# Patient Record
Sex: Female | Born: 1945 | Race: White | Hispanic: No
Health system: Southern US, Community
[De-identification: ages and names within clinical notes are randomized; demographics above are authoritative.]

---

## 1999-02-24 ENCOUNTER — Other Ambulatory Visit: Admission: RE | Admit: 1999-02-24 | Discharge: 1999-02-24 | Payer: Self-pay | Admitting: Obstetrics and Gynecology

## 1999-02-25 ENCOUNTER — Other Ambulatory Visit: Admission: RE | Admit: 1999-02-25 | Discharge: 1999-02-25 | Payer: Self-pay | Admitting: Obstetrics and Gynecology

## 2000-04-28 ENCOUNTER — Other Ambulatory Visit: Admission: RE | Admit: 2000-04-28 | Discharge: 2000-04-28 | Payer: Self-pay | Admitting: Obstetrics and Gynecology

## 2001-07-03 ENCOUNTER — Emergency Department (HOSPITAL_COMMUNITY): Admission: EM | Admit: 2001-07-03 | Discharge: 2001-07-03 | Payer: Self-pay | Admitting: Emergency Medicine

## 2001-07-12 ENCOUNTER — Other Ambulatory Visit: Admission: RE | Admit: 2001-07-12 | Discharge: 2001-07-12 | Payer: Self-pay | Admitting: Gynecology

## 2002-06-22 ENCOUNTER — Encounter: Payer: Self-pay | Admitting: Emergency Medicine

## 2002-06-22 ENCOUNTER — Inpatient Hospital Stay (HOSPITAL_COMMUNITY): Admission: EM | Admit: 2002-06-22 | Discharge: 2002-06-25 | Payer: Self-pay | Admitting: Emergency Medicine

## 2010-10-28 ENCOUNTER — Other Ambulatory Visit: Payer: Self-pay | Admitting: Family Medicine

## 2013-05-23 ENCOUNTER — Other Ambulatory Visit (HOSPITAL_COMMUNITY): Payer: Self-pay | Admitting: Endocrinology

## 2013-05-23 DIAGNOSIS — E0590 Thyrotoxicosis, unspecified without thyrotoxic crisis or storm: Secondary | ICD-10-CM

## 2013-05-29 ENCOUNTER — Ambulatory Visit (HOSPITAL_COMMUNITY)
Admission: RE | Admit: 2013-05-29 | Discharge: 2013-05-29 | Disposition: A | Discharge status: Home / Self Care | Payer: Medicare Other | Source: Ambulatory Visit | Attending: Endocrinology | Admitting: Endocrinology

## 2013-05-29 DIAGNOSIS — E0590 Thyrotoxicosis, unspecified without thyrotoxic crisis or storm: Secondary | ICD-10-CM

## 2013-05-29 MED ORDER — SODIUM IODIDE I 131 CAPSULE
6.7000 | Freq: Once | INTRAVENOUS | Status: AC | PRN
  Administered 2013-05-29: 6.7 via ORAL

## 2013-05-30 ENCOUNTER — Encounter (HOSPITAL_COMMUNITY)
Admission: RE | Admit: 2013-05-30 | Discharge: 2013-05-30 | Disposition: A | Discharge status: Home / Self Care | Payer: Medicare Other | Source: Ambulatory Visit | Attending: Endocrinology | Admitting: Endocrinology

## 2013-05-30 MED ORDER — SODIUM PERTECHNETATE TC 99M INJECTION
10.0000 | Freq: Once | INTRAVENOUS | Status: AC | PRN
  Administered 2013-05-30: 10 via INTRAVENOUS

## 2013-06-01 ENCOUNTER — Ambulatory Visit (HOSPITAL_COMMUNITY): Payer: Self-pay

## 2013-06-02 ENCOUNTER — Encounter (HOSPITAL_COMMUNITY): Payer: Self-pay

## 2013-11-29 ENCOUNTER — Other Ambulatory Visit: Payer: Self-pay | Admitting: *Deleted

## 2013-11-29 DIAGNOSIS — R002 Palpitations: Secondary | ICD-10-CM

## 2013-11-30 ENCOUNTER — Encounter: Payer: Self-pay | Admitting: *Deleted

## 2013-11-30 ENCOUNTER — Encounter (INDEPENDENT_AMBULATORY_CARE_PROVIDER_SITE_OTHER): Payer: Medicare Other

## 2013-11-30 DIAGNOSIS — R002 Palpitations: Secondary | ICD-10-CM

## 2013-11-30 NOTE — Progress Notes (Signed)
Patient ID: Cynthia Solomon, female   DOB: 02/12/1945, 68 y.o.   MRN: [id number] [employer] verite 7 day cardiac event monitor applied to patient.

## 2015-03-08 DIAGNOSIS — H401133 Primary open-angle glaucoma, bilateral, severe stage: Secondary | ICD-10-CM | POA: Diagnosis not present

## 2015-03-08 DIAGNOSIS — H2513 Age-related nuclear cataract, bilateral: Secondary | ICD-10-CM | POA: Diagnosis not present

## 2015-03-08 DIAGNOSIS — H43813 Vitreous degeneration, bilateral: Secondary | ICD-10-CM | POA: Diagnosis not present

## 2015-03-08 DIAGNOSIS — H524 Presbyopia: Secondary | ICD-10-CM | POA: Diagnosis not present

## 2015-04-10 DIAGNOSIS — F419 Anxiety disorder, unspecified: Secondary | ICD-10-CM | POA: Diagnosis not present

## 2015-05-22 DIAGNOSIS — J45909 Unspecified asthma, uncomplicated: Secondary | ICD-10-CM | POA: Diagnosis not present

## 2015-06-14 DIAGNOSIS — H401113 Primary open-angle glaucoma, right eye, severe stage: Secondary | ICD-10-CM | POA: Diagnosis not present

## 2015-06-26 DIAGNOSIS — H401121 Primary open-angle glaucoma, left eye, mild stage: Secondary | ICD-10-CM | POA: Diagnosis not present

## 2015-06-26 DIAGNOSIS — H401113 Primary open-angle glaucoma, right eye, severe stage: Secondary | ICD-10-CM | POA: Diagnosis not present

## 2015-08-05 DIAGNOSIS — F4320 Adjustment disorder, unspecified: Secondary | ICD-10-CM | POA: Diagnosis not present

## 2015-10-30 DIAGNOSIS — F4320 Adjustment disorder, unspecified: Secondary | ICD-10-CM | POA: Diagnosis not present

## 2015-12-30 DIAGNOSIS — H401121 Primary open-angle glaucoma, left eye, mild stage: Secondary | ICD-10-CM | POA: Diagnosis not present

## 2015-12-30 DIAGNOSIS — H401113 Primary open-angle glaucoma, right eye, severe stage: Secondary | ICD-10-CM | POA: Diagnosis not present

## 2016-03-04 DIAGNOSIS — F4320 Adjustment disorder, unspecified: Secondary | ICD-10-CM | POA: Diagnosis not present

## 2016-03-04 DIAGNOSIS — L659 Nonscarring hair loss, unspecified: Secondary | ICD-10-CM | POA: Diagnosis not present

## 2016-03-04 DIAGNOSIS — M542 Cervicalgia: Secondary | ICD-10-CM | POA: Diagnosis not present

## 2016-03-09 DIAGNOSIS — M79671 Pain in right foot: Secondary | ICD-10-CM | POA: Diagnosis not present

## 2016-03-27 DIAGNOSIS — Z1231 Encounter for screening mammogram for malignant neoplasm of breast: Secondary | ICD-10-CM | POA: Diagnosis not present

## 2016-03-30 DIAGNOSIS — N6489 Other specified disorders of breast: Secondary | ICD-10-CM | POA: Diagnosis not present

## 2016-04-08 DIAGNOSIS — M79671 Pain in right foot: Secondary | ICD-10-CM | POA: Diagnosis not present

## 2016-04-20 DIAGNOSIS — M25571 Pain in right ankle and joints of right foot: Secondary | ICD-10-CM | POA: Diagnosis not present

## 2016-05-21 DIAGNOSIS — M79671 Pain in right foot: Secondary | ICD-10-CM | POA: Diagnosis not present

## 2016-06-02 DIAGNOSIS — H401133 Primary open-angle glaucoma, bilateral, severe stage: Secondary | ICD-10-CM | POA: Diagnosis not present

## 2016-06-02 DIAGNOSIS — H2513 Age-related nuclear cataract, bilateral: Secondary | ICD-10-CM | POA: Diagnosis not present

## 2016-06-02 DIAGNOSIS — H524 Presbyopia: Secondary | ICD-10-CM | POA: Diagnosis not present

## 2016-06-18 DIAGNOSIS — M79671 Pain in right foot: Secondary | ICD-10-CM | POA: Diagnosis not present

## 2016-06-18 DIAGNOSIS — M25511 Pain in right shoulder: Secondary | ICD-10-CM | POA: Diagnosis not present

## 2016-06-26 DIAGNOSIS — H401121 Primary open-angle glaucoma, left eye, mild stage: Secondary | ICD-10-CM | POA: Diagnosis not present

## 2016-06-26 DIAGNOSIS — H401113 Primary open-angle glaucoma, right eye, severe stage: Secondary | ICD-10-CM | POA: Diagnosis not present

## 2016-07-01 DIAGNOSIS — H401113 Primary open-angle glaucoma, right eye, severe stage: Secondary | ICD-10-CM | POA: Diagnosis not present

## 2016-07-01 DIAGNOSIS — H401121 Primary open-angle glaucoma, left eye, mild stage: Secondary | ICD-10-CM | POA: Diagnosis not present

## 2016-07-01 DIAGNOSIS — H2513 Age-related nuclear cataract, bilateral: Secondary | ICD-10-CM | POA: Diagnosis not present

## 2016-07-02 DIAGNOSIS — F4320 Adjustment disorder, unspecified: Secondary | ICD-10-CM | POA: Diagnosis not present

## 2016-07-02 DIAGNOSIS — R21 Rash and other nonspecific skin eruption: Secondary | ICD-10-CM | POA: Diagnosis not present

## 2019-02-24 ENCOUNTER — Ambulatory Visit: Payer: Medicare HMO | Attending: Internal Medicine

## 2019-02-24 DIAGNOSIS — Z23 Encounter for immunization: Secondary | ICD-10-CM

## 2019-02-24 NOTE — Progress Notes (Signed)
   Covid-19 Vaccination Clinic  Name:  Cynthia Solomon    MRN: [id number] DOB: 12-11-1945  02/24/2019  [name] was observed post Covid-19 immunization for 15 minutes without incidence. She was provided with Vaccine Information Sheet and instruction to access the V-Safe system.   [name] was instructed to call 911 with any severe reactions post vaccine: Marland Kitchen Difficulty breathing  . Swelling of your face and throat  . A fast heartbeat  . A bad rash all over your body  . Dizziness and weakness    Immunizations Administered    Name Date Dose VIS Date Route   Pfizer COVID-19 Vaccine 02/24/2019  9:47 AM 0.3 mL 01/13/2019 Intramuscular   Manufacturer: [employer], [employer]   Lot: [id number]   NDC: [address]-0

## 2019-03-16 ENCOUNTER — Ambulatory Visit: Payer: Medicare HMO | Attending: Internal Medicine

## 2019-03-16 DIAGNOSIS — Z23 Encounter for immunization: Secondary | ICD-10-CM | POA: Insufficient documentation

## 2019-03-16 NOTE — Progress Notes (Signed)
   Covid-19 Vaccination Clinic  Name:  AJAI TERHAAR    MRN: [id number] DOB: 1945-10-22  03/16/2019  Ms. Habib was observed post Covid-19 immunization for 15 minutes without incidence. She was provided with Vaccine Information Sheet and instruction to access the V-Safe system.   Ms. Hagberg was instructed to call 911 with any severe reactions post vaccine: Marland Kitchen Difficulty breathing  . Swelling of your face and throat  . A fast heartbeat  . A bad rash all over your body  . Dizziness and weakness    Immunizations Administered    Name Date Dose VIS Date Route   Pfizer COVID-19 Vaccine 03/16/2019  3:10 PM 0.3 mL 01/13/2019 Intramuscular   Manufacturer: [employer], [employer]   Lot: [id number]   NDC: [id number]

## 2019-03-22 ENCOUNTER — Ambulatory Visit: Payer: Self-pay

## 2020-06-28 ENCOUNTER — Emergency Department (HOSPITAL_COMMUNITY): Payer: Medicare HMO

## 2020-06-28 ENCOUNTER — Emergency Department (HOSPITAL_COMMUNITY)
Admission: EM | Admit: 2020-06-28 | Discharge: 2020-06-29 | Disposition: A | Discharge status: Home / Self Care | Payer: Medicare HMO | Attending: Emergency Medicine | Admitting: Emergency Medicine

## 2020-06-28 DIAGNOSIS — W541XXA Struck by dog, initial encounter: Secondary | ICD-10-CM | POA: Insufficient documentation

## 2020-06-28 DIAGNOSIS — S7002XA Contusion of left hip, initial encounter: Secondary | ICD-10-CM | POA: Insufficient documentation

## 2020-06-28 DIAGNOSIS — R58 Hemorrhage, not elsewhere classified: Secondary | ICD-10-CM

## 2020-06-28 DIAGNOSIS — M1990 Unspecified osteoarthritis, unspecified site: Secondary | ICD-10-CM

## 2020-06-28 DIAGNOSIS — S79922A Unspecified injury of left thigh, initial encounter: Secondary | ICD-10-CM | POA: Diagnosis present

## 2020-06-28 NOTE — ED Triage Notes (Signed)
Pt came in with c/o L hip pain after getting knocked over by her dog in the driveway. Abrasion noted to L elbow as well. No shortening noted. Pt states it hurts to bear weight

## 2020-06-28 NOTE — ED Provider Notes (Signed)
Emergency Medicine Provider Triage Evaluation Note  Cynthia Solomon , a 75 y.o. female  was evaluated in triage.  Pt complains of left hip pain after a fall.  Review of Systems  Positive: Left hip pain Negative: No head trauma or loc  Physical Exam  BP (!) 137/94 (BP Location: Right Arm)   Pulse 83   Temp 98.1 F (36.7 C) (Oral)   Resp 18   Ht 5\' 3"  (1.6 m)   Wt 56.7 kg   SpO2 97%   BMI 22.14 kg/m  Gen:   Awake, no distress   Resp:  Normal effort  MSK:   Moves extremities without difficulty  Other:  ttp to the left hip, clear speech  Medical Decision Making  Medically screening exam initiated at 9:52 PM.  Appropriate orders placed.  JERZEY KOMPERDA was informed that the remainder of the evaluation will be completed by another provider, this initial triage assessment does not replace that evaluation, and the importance of remaining in the ED until their evaluation is complete.     Thompson Grayer 06/28/20 2154    2155, MD 06/28/20 2328

## 2020-06-29 ENCOUNTER — Encounter (HOSPITAL_COMMUNITY): Payer: Self-pay | Admitting: Emergency Medicine

## 2020-06-29 MED ORDER — DICLOFENAC SODIUM ER 100 MG PO TB24: 100.0000 mg | ORAL_TABLET | Freq: Every day | ORAL | 0 refills | Status: AC

## 2020-06-29 MED ORDER — LIDOCAINE 5 % EX PTCH: 1.0000 | MEDICATED_PATCH | CUTANEOUS | 0 refills | Status: AC

## 2020-06-29 MED ORDER — IBUPROFEN 800 MG PO TABS
800.0000 mg | ORAL_TABLET | Freq: Once | ORAL | Status: AC
  Administered 2020-06-29: 800 mg via ORAL
  Filled 2020-06-29: qty 1

## 2020-06-29 MED ORDER — LIDOCAINE 5 % EX PTCH
2.0000 | MEDICATED_PATCH | CUTANEOUS | Status: DC
  Administered 2020-06-29: 2 via TRANSDERMAL
  Filled 2020-06-29: qty 2

## 2020-06-29 NOTE — ED Provider Notes (Signed)
[facility]-EMERGENCY DEPT Provider Note   CSN: [id number] Arrival date & time: 06/28/20  2124     History Chief Complaint  Patient presents with  . Hip Pain    Cynthia Solomon is a 75 y.o. female.  The history is provided by the patient.  Hip Pain This is a new problem. The current episode started 3 to 5 hours ago. The problem occurs constantly. The problem has not changed since onset.Pertinent negatives include no chest pain, no abdominal pain, no headaches and no shortness of breath. Nothing aggravates the symptoms. Nothing relieves the symptoms. She has tried nothing for the symptoms. The treatment provided no relief.  Patient was hit in the posterior left hip by a dog and has a bruise and it hurts to walk.       History reviewed. No pertinent past medical history.  There are no problems to display for this patient.   History reviewed. No pertinent surgical history.   OB History   No obstetric history on file.     History reviewed. No pertinent family history.     Home Medications Prior to Admission medications   Not on File    Allergies    Contrast media [iodinated diagnostic agents], Singulair [montelukast], and Sulfites  Review of Systems   Review of Systems  Constitutional: Negative for fever.  HENT: Negative for congestion.   Eyes: Negative for visual disturbance.  Respiratory: Negative for shortness of breath.   Cardiovascular: Negative for chest pain.  Gastrointestinal: Negative for abdominal pain.  Genitourinary: Negative for difficulty urinating.  Musculoskeletal: Positive for arthralgias.  Skin: Negative for rash.  Neurological: Negative for headaches.  Psychiatric/Behavioral: Negative for agitation.  All other systems reviewed and are negative.   Physical Exam Updated Vital Signs BP 130/84   Pulse 82   Temp 98.6 F (37 C) (Oral)   Resp 18   Ht 5\' 3"  (1.6 m)   Wt 56.7 kg   SpO2 100%   BMI 22.14 kg/m    Physical Exam Vitals and nursing note reviewed.  Constitutional:      General: She is not in acute distress.    Appearance: Normal appearance.  HENT:     Head: Normocephalic and atraumatic.     Nose: Nose normal.  Eyes:     Conjunctiva/sclera: Conjunctivae normal.     Pupils: Pupils are equal, round, and reactive to light.  Cardiovascular:     Rate and Rhythm: Normal rate and regular rhythm.     Pulses: Normal pulses.     Heart sounds: Normal heart sounds.  Pulmonary:     Effort: Pulmonary effort is normal.     Breath sounds: Normal breath sounds.  Abdominal:     General: Abdomen is flat. Bowel sounds are normal.     Palpations: Abdomen is soft.     Tenderness: There is no abdominal tenderness. There is no guarding.  Musculoskeletal:        General: Normal range of motion.     Cervical back: Normal range of motion and neck supple.     Left hip: No deformity, lacerations or crepitus. Normal range of motion. Normal strength.     Left upper leg: Normal.     Left knee: Normal.     Left ankle: Normal.     Left Achilles Tendon: Normal.     Left foot: Normal.       Legs:  Skin:    General: Skin is warm and dry.  Capillary Refill: Capillary refill takes less than 2 seconds.  Neurological:     General: No focal deficit present.     Mental Status: She is alert and oriented to person, place, and time.     Deep Tendon Reflexes: Reflexes normal.  Psychiatric:        Mood and Affect: Mood normal.        Behavior: Behavior normal.     ED Results / Procedures / Treatments   Labs (all labs ordered are listed, but only abnormal results are displayed) Labs Reviewed - No data to display  EKG None  Radiology DG Hip Unilat W or Wo Pelvis 2-3 Views Left  Result Date: 06/28/2020 CLINICAL DATA:  Fall with left hip pain. EXAM: DG HIP (WITH OR WITHOUT PELVIS) 2-3V LEFT COMPARISON:  None. FINDINGS: No evidence of acute fracture. The femoral head is well seated. Mild left hip  osteoarthritis with acetabular spurring. Pubic rami are intact. Pubic symphysis and sacroiliac joints are congruent. There is a pessary in place. IMPRESSION: Mild left hip osteoarthritis. No acute fracture. Electronically Signed   By: Narda Rutherford M.D.   On: 06/28/2020 22:40    Procedures Procedures   Medications Ordered in ED Medications  lidocaine (LIDODERM) 5 % 2 patch (2 patches Transdermal Patch Applied 06/29/20 0051)  ibuprofen (ADVIL) tablet 800 mg (has no administration in time range)    ED Course  I have reviewed the triage vital signs and the nursing notes.  Pertinent labs & imaging results that were available during my care of the patient were reviewed by me and considered in my medical decision making (see chart for details).    Patient with ecchymosis.  No foreshortening no rotation.  No pain with motion.  FROM.  Xrays are negative.  Patient is stable for discharge with close follow up.    Cynthia Solomon was evaluated in Emergency Department on 06/29/2020 for the symptoms described in the history of present illness. She was evaluated in the context of the global COVID-19 pandemic, which necessitated consideration that the patient might be at risk for infection with the SARS-CoV-2 virus that causes COVID-19. Institutional protocols and algorithms that pertain to the evaluation of patients at risk for COVID-19 are in a state of rapid change based on information released by regulatory bodies including the CDC and federal and state organizations. These policies and algorithms were followed during the patient's care in the ED.]  Final Clinical Impression(s) / ED Diagnoses Return for intractable cough, coughing up blood, fevers >100.4 unrelieved by medication, shortness of breath, intractable vomiting, chest pain, shortness of breath, weakness, numbness, changes in speech, facial asymmetry, abdominal pain, passing out, Inability to tolerate liquids or food, cough, altered mental  status or any concerns. No signs of systemic illness or infection. The patient is nontoxic-appearing on exam and vital signs are within normal limits.  I have reviewed the triage vital signs and the nursing notes. Pertinent labs & imaging results that were available during my care of the patient were reviewed by me and considered in my medical decision making (see chart for details). After history, exam, and medical workup I feel the patient has been appropriately medically screened and is safe for discharge home. Pertinent diagnoses were discussed with the patient. Patient was given return precautions.   Lachlan Mckim, MD 06/29/20 4008

## 2020-10-17 LAB — COLOGUARD: COLOGUARD: NEGATIVE

## 2022-12-17 IMAGING — CR DG HIP (WITH OR WITHOUT PELVIS) 2-3V*L*
3 series · 3 of 3 positions shown · non-contrast
Comparison: None.

CLINICAL DATA: Fall with left hip pain.

EXAM:
DG HIP (WITH OR WITHOUT PELVIS) 2-3V LEFT

[t pelvis ap]
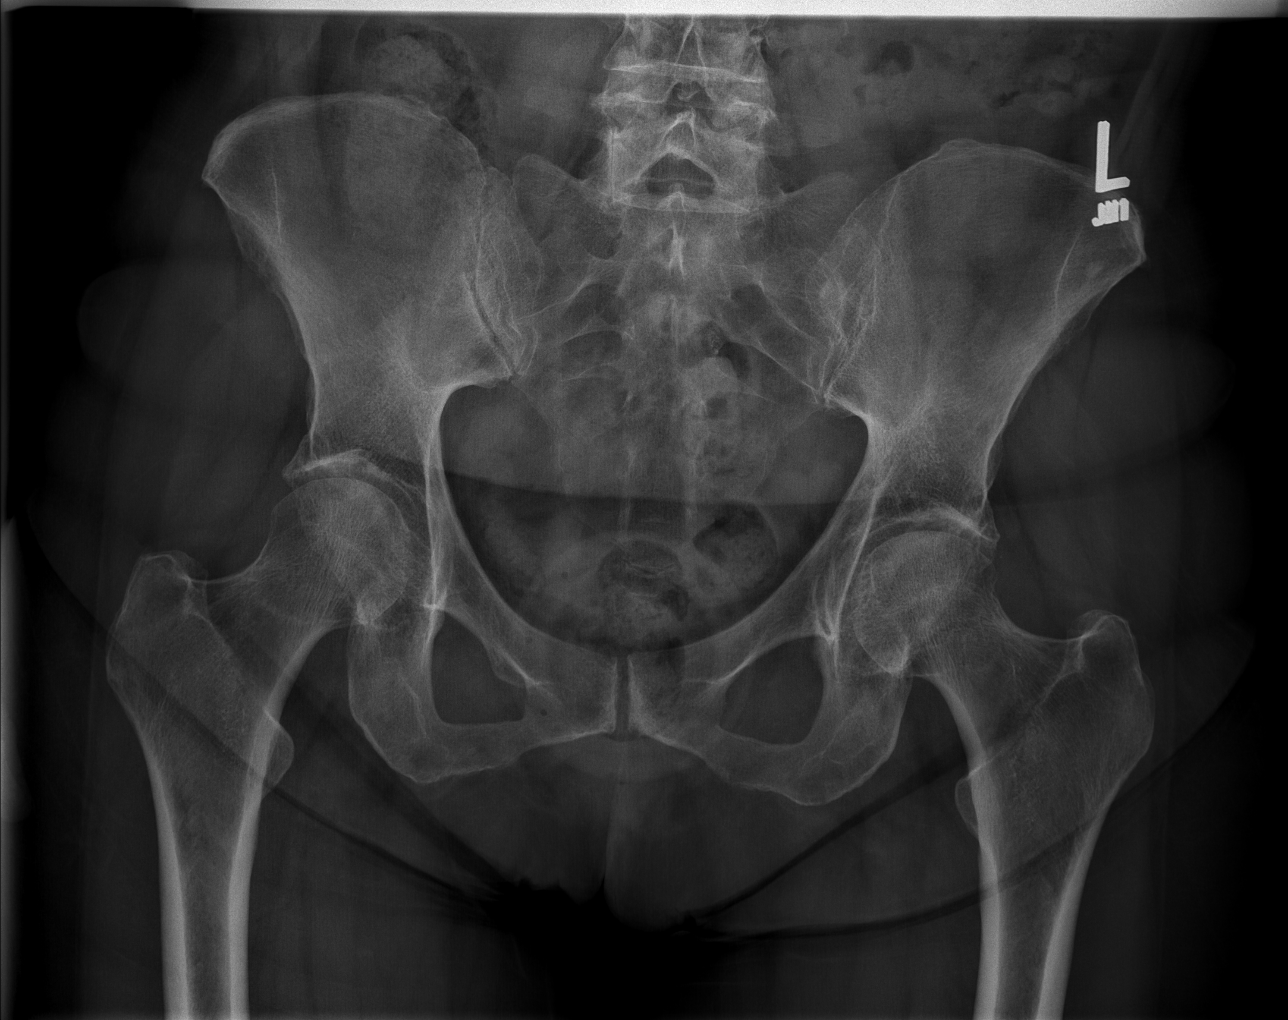

[t hip ap left]
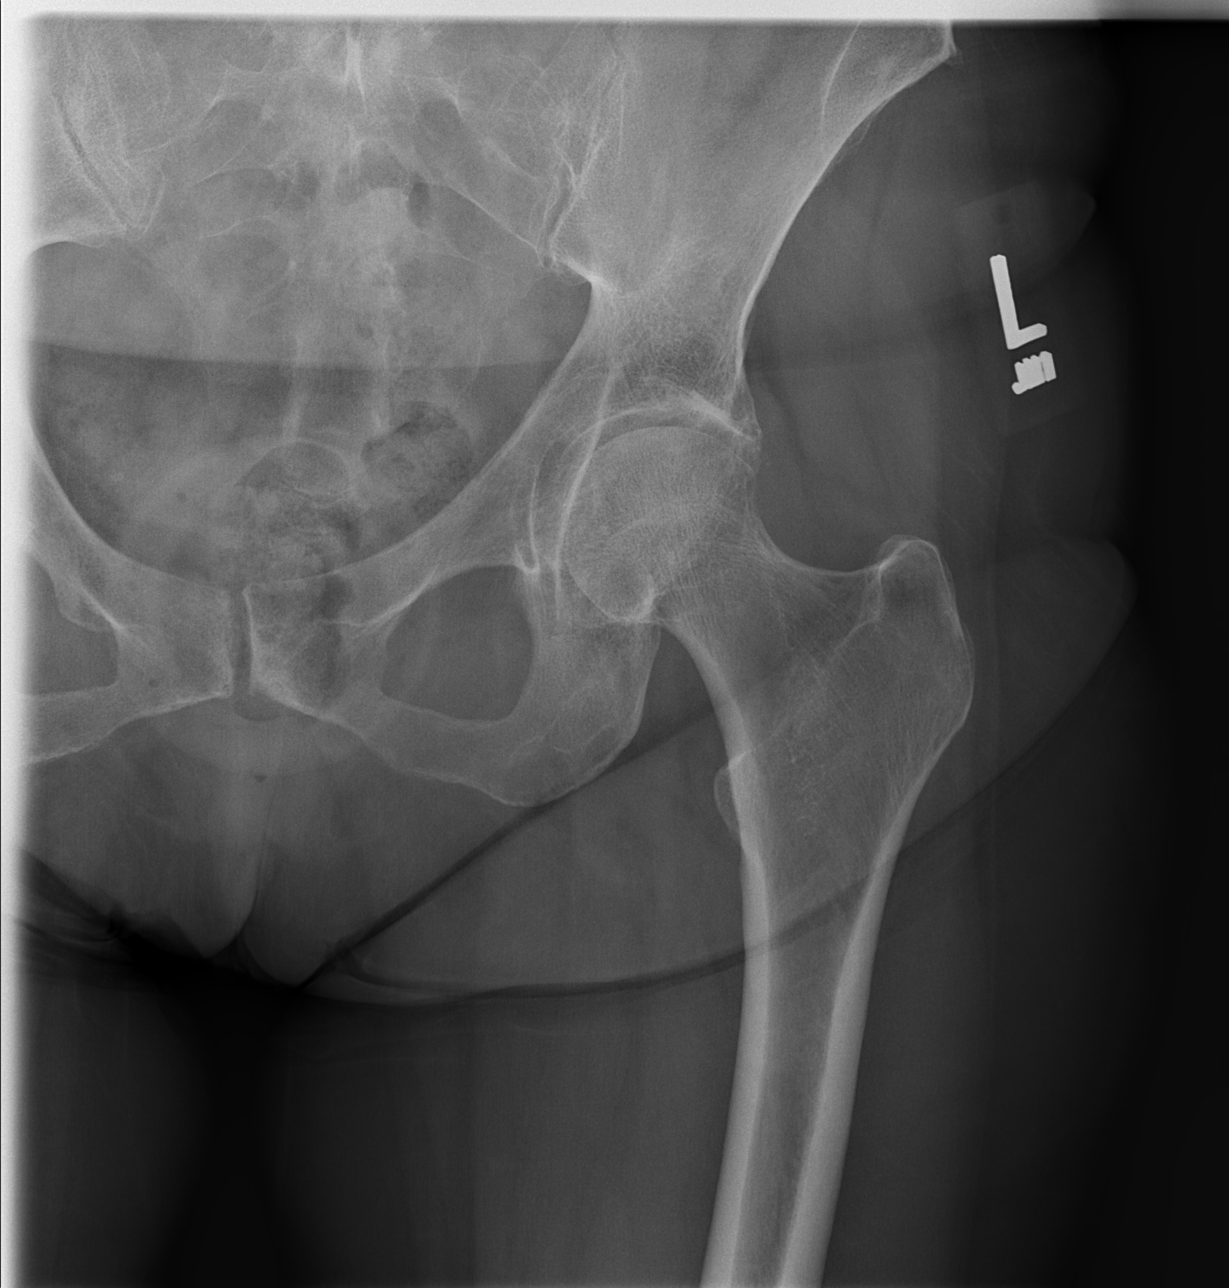

[t hip frog leg left]
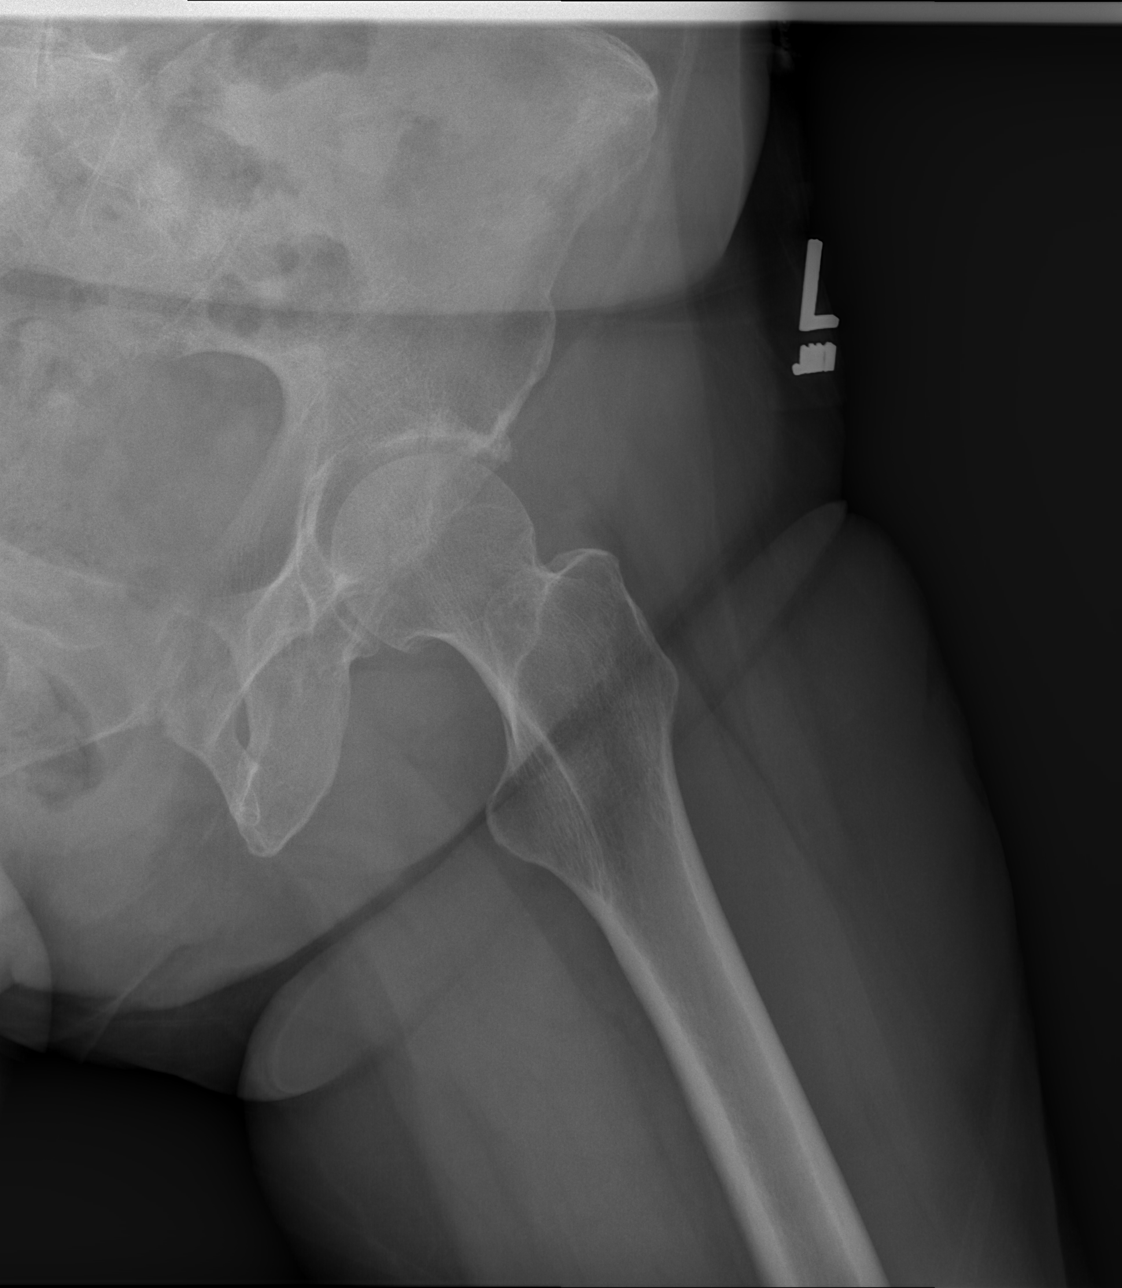

[3 of 3 positions shown; findings below may reference images not displayed]

FINDINGS: No evidence of acute fracture. The femoral head is well seated. Mild
left hip osteoarthritis with acetabular spurring. Pubic rami are
intact. Pubic symphysis and sacroiliac joints are congruent. There
is a pessary in place.
IMPRESSION: Mild left hip osteoarthritis. No acute fracture.
